# Patient Record
Sex: Female | Born: 1995 | Race: Black or African American | Hispanic: No | Marital: Single | Smoking: Never smoker
Health system: Southern US, Community
[De-identification: ages and names within clinical notes are randomized; demographics above are authoritative.]

## PROBLEM LIST (undated history)

## (undated) DIAGNOSIS — D571 Sickle-cell disease without crisis: Secondary | ICD-10-CM

## (undated) DIAGNOSIS — J45909 Unspecified asthma, uncomplicated: Secondary | ICD-10-CM

## (undated) HISTORY — PX: TONSILLECTOMY: SUR1361

---

## 2018-09-09 ENCOUNTER — Emergency Department (HOSPITAL_COMMUNITY): Payer: Managed Care, Other (non HMO)

## 2018-09-09 ENCOUNTER — Other Ambulatory Visit: Payer: Self-pay

## 2018-09-09 ENCOUNTER — Emergency Department (HOSPITAL_COMMUNITY)
Admission: EM | Admit: 2018-09-09 | Discharge: 2018-09-09 | Disposition: A | Discharge status: Home / Self Care | Payer: Managed Care, Other (non HMO) | Attending: Emergency Medicine | Admitting: Emergency Medicine

## 2018-09-09 ENCOUNTER — Encounter (HOSPITAL_COMMUNITY): Payer: Self-pay | Admitting: Emergency Medicine

## 2018-09-09 DIAGNOSIS — D571 Sickle-cell disease without crisis: Secondary | ICD-10-CM | POA: Insufficient documentation

## 2018-09-09 DIAGNOSIS — J020 Streptococcal pharyngitis: Secondary | ICD-10-CM | POA: Diagnosis not present

## 2018-09-09 DIAGNOSIS — R509 Fever, unspecified: Secondary | ICD-10-CM | POA: Diagnosis present

## 2018-09-09 DIAGNOSIS — Z79899 Other long term (current) drug therapy: Secondary | ICD-10-CM | POA: Diagnosis not present

## 2018-09-09 DIAGNOSIS — N10 Acute tubulo-interstitial nephritis: Secondary | ICD-10-CM | POA: Diagnosis not present

## 2018-09-09 DIAGNOSIS — N12 Tubulo-interstitial nephritis, not specified as acute or chronic: Secondary | ICD-10-CM

## 2018-09-09 HISTORY — DX: Sickle-cell disease without crisis: D57.1

## 2018-09-09 HISTORY — DX: Unspecified asthma, uncomplicated: J45.909

## 2018-09-09 LAB — RETICULOCYTES
Immature Retic Fract: 34.2 % — ABNORMAL HIGH (ref 2.3–15.9)
RBC.: 2.59 MIL/uL — ABNORMAL LOW (ref 3.87–5.11)
Retic Count, Absolute: 358.7 10*3/uL — ABNORMAL HIGH (ref 19.0–186.0)
Retic Ct Pct: 13.9 % — ABNORMAL HIGH (ref 0.4–3.1)

## 2018-09-09 LAB — COMPREHENSIVE METABOLIC PANEL
ALT: 19 U/L (ref 0–44)
AST: 30 U/L (ref 15–41)
Albumin: 4.7 g/dL (ref 3.5–5.0)
Alkaline Phosphatase: 54 U/L (ref 38–126)
Anion gap: 9 (ref 5–15)
BUN: 7 mg/dL (ref 6–20)
CO2: 20 mmol/L — ABNORMAL LOW (ref 22–32)
Calcium: 9.1 mg/dL (ref 8.9–10.3)
Chloride: 107 mmol/L (ref 98–111)
Creatinine, Ser: 0.73 mg/dL (ref 0.44–1.00)
Glucose, Bld: 94 mg/dL (ref 70–99)
POTASSIUM: 3.8 mmol/L (ref 3.5–5.1)
SODIUM: 136 mmol/L (ref 135–145)
Total Bilirubin: 5.3 mg/dL — ABNORMAL HIGH (ref 0.3–1.2)
Total Protein: 8.1 g/dL (ref 6.5–8.1)

## 2018-09-09 LAB — CBC WITH DIFFERENTIAL/PLATELET
ABS IMMATURE GRANULOCYTES: 0.23 10*3/uL — AB (ref 0.00–0.07)
BASOS PCT: 1 %
Basophils Absolute: 0.1 10*3/uL (ref 0.0–0.1)
EOS ABS: 0.5 10*3/uL (ref 0.0–0.5)
Eosinophils Relative: 2 %
HCT: 24.3 % — ABNORMAL LOW (ref 36.0–46.0)
Hemoglobin: 8.5 g/dL — ABNORMAL LOW (ref 12.0–15.0)
IMMATURE GRANULOCYTES: 1 %
Lymphocytes Relative: 15 %
Lymphs Abs: 3.6 10*3/uL (ref 0.7–4.0)
MCH: 32.8 pg (ref 26.0–34.0)
MCHC: 35 g/dL (ref 30.0–36.0)
MCV: 93.8 fL (ref 80.0–100.0)
Monocytes Absolute: 2 10*3/uL — ABNORMAL HIGH (ref 0.1–1.0)
Monocytes Relative: 8 %
NEUTROS ABS: 18.1 10*3/uL — AB (ref 1.7–7.7)
NEUTROS PCT: 73 %
NRBC: 1.6 % — AB (ref 0.0–0.2)
PLATELETS: 340 10*3/uL (ref 150–400)
RBC: 2.59 MIL/uL — AB (ref 3.87–5.11)
RDW: 20.5 % — AB (ref 11.5–15.5)
WBC: 24.7 10*3/uL — AB (ref 4.0–10.5)

## 2018-09-09 LAB — URINALYSIS, ROUTINE W REFLEX MICROSCOPIC
BILIRUBIN URINE: NEGATIVE
Glucose, UA: NEGATIVE mg/dL
KETONES UR: 20 mg/dL — AB
NITRITE: POSITIVE — AB
PROTEIN: NEGATIVE mg/dL
Specific Gravity, Urine: 1.009 (ref 1.005–1.030)
pH: 9 — ABNORMAL HIGH (ref 5.0–8.0)

## 2018-09-09 LAB — I-STAT CG4 LACTIC ACID, ED: LACTIC ACID, VENOUS: 0.93 mmol/L (ref 0.5–1.9)

## 2018-09-09 LAB — I-STAT BETA HCG BLOOD, ED (MC, WL, AP ONLY): I-stat hCG, quantitative: <5 m[IU]/mL (ref <5)

## 2018-09-09 LAB — GROUP A STREP BY PCR: Group A Strep by PCR: DETECTED — AB

## 2018-09-09 LAB — INFLUENZA PANEL BY PCR (TYPE A & B)
INFLAPCR: NEGATIVE
INFLBPCR: NEGATIVE

## 2018-09-09 MED ORDER — SODIUM CHLORIDE 0.9 % IV SOLN
1.0000 g | Freq: Once | INTRAVENOUS | Status: AC
  Administered 2018-09-09: 1 g via INTRAVENOUS
  Filled 2018-09-09: qty 10

## 2018-09-09 MED ORDER — CEPHALEXIN 500 MG PO CAPS: 500.0000 mg | ORAL_CAPSULE | Freq: Three times a day (TID) | ORAL | 0 refills | Status: DC

## 2018-09-09 MED ORDER — ACETAMINOPHEN 325 MG PO TABS
650.0000 mg | ORAL_TABLET | Freq: Once | ORAL | Status: AC | PRN
  Administered 2018-09-09: 650 mg via ORAL
  Filled 2018-09-09: qty 2

## 2018-09-09 MED ORDER — SODIUM CHLORIDE 0.9 % IV BOLUS
1000.0000 mL | Freq: Once | INTRAVENOUS | Status: AC
  Administered 2018-09-09: 1000 mL via INTRAVENOUS

## 2018-09-09 NOTE — ED Provider Notes (Signed)
[facility]-EMERGENCY DEPT Provider Note   CSN: [id number] Arrival date & time: 09/09/18  1858     History   Chief Complaint Chief Complaint  Patient presents with  . Fever  . Sore Throat    HPI Sara Luna is a 22 y.o. female history of sickle cell anemia, here presenting with fever, sore throat.  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  She also did not get her to shot this year.  She states that she woke up today with a sore throat.  States that she also has some subjective chills.  She also had a fever 103 at home.  She complains of some right lower back pain as well.  Denies any cough or shortness of breath.  She denies any urinary symptoms or vomiting.  Patient states that she has a history of sickle cell and is currently on hydroxyurea and ibuprofen and Tylenol.  She not currently on any pain medicines.  Patient did have acute chest about a year ago but she was at [facility] at that time.  She states that she follows up with community wellness currently and does not have a sickle cell doctor.   The history is provided by the patient.    Past Medical History:  Diagnosis Date  . Asthma   . Sickle cell anemia (HCC)     There are no active problems to display for this patient.   Past Surgical History:  Procedure Laterality Date  . TONSILLECTOMY       OB History   None      Home Medications    Prior to Admission medications   Medication Sig Start Date End Date Taking? Authorizing Provider  albuterol (PROVENTIL HFA) 108 (90 Base) MCG/ACT inhaler Inhale 2 puffs into the lungs every 4 (four) hours as needed for wheezing or shortness of breath.  05/13/11  Yes [provider]  EPINEPHrine 0.3 mg/0.3 mL IJ SOAJ injection Inject 0.3 mg into the muscle once as needed (allergic reaction).  05/22/13  Yes [provider]  folic acid (FOLVITE) 1 MG tablet Take 1 mg by mouth daily.    Yes  [provider]  hydroxyurea (HYDREA) 500 MG capsule Take 500 mg by mouth daily.  07/29/17  Yes [provider]    Family History No family history on file.  Social History Social History   Tobacco Use  . Smoking status: Never Smoker  . Smokeless tobacco: Never Used  Substance Use Topics  . Alcohol use: Yes    Comment: occassionally  . Drug use: Not Currently     Allergies   Peanut-containing drug products   Review of Systems Review of Systems  Constitutional: Positive for fever.  HENT: Positive for sore throat.   All other systems reviewed and are negative.    Physical Exam Updated Vital Signs BP (!) 120/58 (BP Location: Right Arm)   Pulse (!) 112   Temp 98.7 F (37.1 C) (Oral)   Resp 18   Ht 5\' 7"  (1.702 m)   Wt 63.5 kg   LMP 08/19/2018 (Exact Date)   SpO2 95%   BMI 21.93 kg/m   Physical Exam  Constitutional: She is oriented to person, place, and time.  Slightly uncomfortable   HENT:  Head: Normocephalic.  Right Ear: Tympanic membrane normal.  Left Ear: Tympanic membrane normal.  Mouth/Throat: Mucous membranes are normal.  Posterior pharynx slightly red, tonsils not enlarged  Eyes: Pupils are equal, round, and reactive to light. EOM are normal.  Neck:  + R cervical LAD   Cardiovascular: Regular rhythm.  Tachycardic   Pulmonary/Chest: Effort normal and breath sounds normal.  Abdominal: Soft. Bowel sounds are normal.  R CVAT vs paralumbar tenderness   Neurological: She is alert and oriented to person, place, and time.  Skin: Skin is warm. Capillary refill takes less than 2 seconds.  Psychiatric: She has a normal mood and affect. Her behavior is normal.  Nursing note and vitals reviewed.    ED Treatments / Results  Labs (all labs ordered are listed, but only abnormal results are displayed) Labs Reviewed  GROUP A STREP BY PCR - Abnormal; Notable for the following components:      Result Value   Group A Strep by PCR DETECTED  (*)    All other components within normal limits  CBC WITH DIFFERENTIAL/PLATELET - Abnormal; Notable for the following components:   WBC 24.7 (*)    RBC 2.59 (*)    Hemoglobin 8.5 (*)    HCT 24.3 (*)    RDW 20.5 (*)    nRBC 1.6 (*)    Neutro Abs 18.1 (*)    Monocytes Absolute 2.0 (*)    Abs Immature Granulocytes 0.23 (*)    All other components within normal limits  COMPREHENSIVE METABOLIC PANEL - Abnormal; Notable for the following components:   CO2 20 (*)    Total Bilirubin 5.3 (*)    All other components within normal limits  RETICULOCYTES - Abnormal; Notable for the following components:   Retic Ct Pct 13.9 (*)    RBC. 2.59 (*)    Retic Count, Absolute 358.7 (*)    Immature Retic Fract 34.2 (*)    All other components within normal limits  URINALYSIS, ROUTINE W REFLEX MICROSCOPIC - Abnormal; Notable for the following components:   pH 9.0 (*)    Hgb urine dipstick SMALL (*)    Ketones, ur 20 (*)    Nitrite POSITIVE (*)    Leukocytes, UA TRACE (*)    Bacteria, UA MANY (*)    All other components within normal limits  CULTURE, BLOOD (ROUTINE X 2)  CULTURE, BLOOD (ROUTINE X 2)  URINE CULTURE  INFLUENZA PANEL BY PCR (TYPE A & B)  I-STAT CG4 LACTIC ACID, ED  I-STAT BETA HCG BLOOD, ED ([facility], [facility], [facility] ONLY)    EKG None  Radiology Dg Chest 2 View  Result Date: 09/09/2018 CLINICAL DATA:  Sickle cell patient.  Fever. EXAM: CHEST - 2 VIEW COMPARISON:  None. FINDINGS: The heart size and mediastinal contours are within normal limits. Both lungs are clear. The visualized skeletal structures are unremarkable. IMPRESSION: No active cardiopulmonary disease. Electronically Signed   By: Burman NievesWilliam  Stevens M.D.   On: 09/09/2018 21:09    Procedures Procedures (including critical care time)  Medications Ordered in ED Medications  acetaminophen (TYLENOL) tablet 650 mg (650 mg Oral Given 09/09/18 1932)  sodium chloride 0.9 % bolus 1,000 mL (1,000 mLs Intravenous New Bag/Given 09/09/18  2022)  cefTRIAXone (ROCEPHIN) 1 g in sodium chloride 0.9 % 100 mL IVPB (0 g Intravenous Stopped 09/09/18 2203)     Initial Impression / Assessment and Plan / ED Course  I have reviewed the triage vital signs and the nursing notes.  Pertinent labs & imaging results that were available during my care of the patient were reviewed by me and considered in my medical decision making (see chart for details).  Sara Luna is a 22 y.o. female here with fever, sore throat. Likely strep vs flu vs pyelo vs acute chest vs pneumonia vs viral syndrome. Will get labs, reticulocyte, CXR, UA, flu, strep. Will hydrate and reassess.   10:37 PM WBC 24. UA + UTI, also strep positive. Both pyelo and strep can cause her WBC count of 24. Patient's CXR is clear. Hg is 8.5 but that is baseline and reticulocyte is elevated appropriately. HR down to low 100s from 130s. No vomiting in the ED. Well appearing. Given rocephin that will treat pyelonephritis and strep. Will dc home with keflex.    Final Clinical Impressions(s) / ED Diagnoses   Final diagnoses:  None    ED Discharge Orders    None       Charlynne Pander, MD 09/09/18 2238

## 2018-09-09 NOTE — ED Notes (Addendum)
Pt refused second set of cultures at this time.

## 2018-09-09 NOTE — Discharge Instructions (Addendum)
Take tylenol, motrin for fever.   Take keflex three times daily for a week for kidney infection and strep throat.   If you don't have fever on Sunday, you can go back to school on Monday.   See your doctor. Consider following up with sickle cell clinic.   Return to ER if you have worse sore throat, fever, trouble breathing, chest pain, abdominal pain, vomiting, dehydration

## 2018-09-09 NOTE — ED Triage Notes (Signed)
Pt presents with a fever and sore throat since yesterday. Patient is a sickle cell patient but not complaining of a crisis. Endorses some back pain but no N/V/D.

## 2018-09-12 LAB — URINE CULTURE: Culture: >=100000 — AB

## 2018-09-13 ENCOUNTER — Telehealth: Payer: Self-pay | Admitting: *Deleted

## 2018-09-13 NOTE — Telephone Encounter (Signed)
Post ED Visit - Positive Culture Follow-up  Culture report reviewed by antimicrobial stewardship pharmacist:  []  Sara Luna, Pharm.D. []  Sara Luna, Pharm.D., BCPS AQ-ID []  Sara Luna, Pharm.D., BCPS []  Sara Luna, [address] BCPS []  Sara Luna, [address] BCPS, AAHIVP []  Sara Luna, Pharm.D., BCPS, AAHIVP []  Sara Luna, PharmD, BCPS []  Sara Luna, PharmD, BCPS []  Sara Luna, PharmD, BCPS []  Sara Luna, PharmD Sara Luna, PharmD   Positive urine culture Treated with Cepahlexin, organism sensitive to the same and no further patient follow-up is required at this time.  Sara Luna, Sara [facility] 09/13/2018, 2:26 PM

## 2018-09-14 LAB — CULTURE, BLOOD (ROUTINE X 2)
CULTURE: NO GROWTH
Culture: NO GROWTH
Special Requests: ADEQUATE

## 2018-12-31 IMAGING — CR DG CHEST 2V
2 series · 2 of 2 positions shown · non-contrast
Comparison: None.

CLINICAL DATA: Sickle cell patient.  Fever.

EXAM:
CHEST - 2 VIEW

[w chest pa]
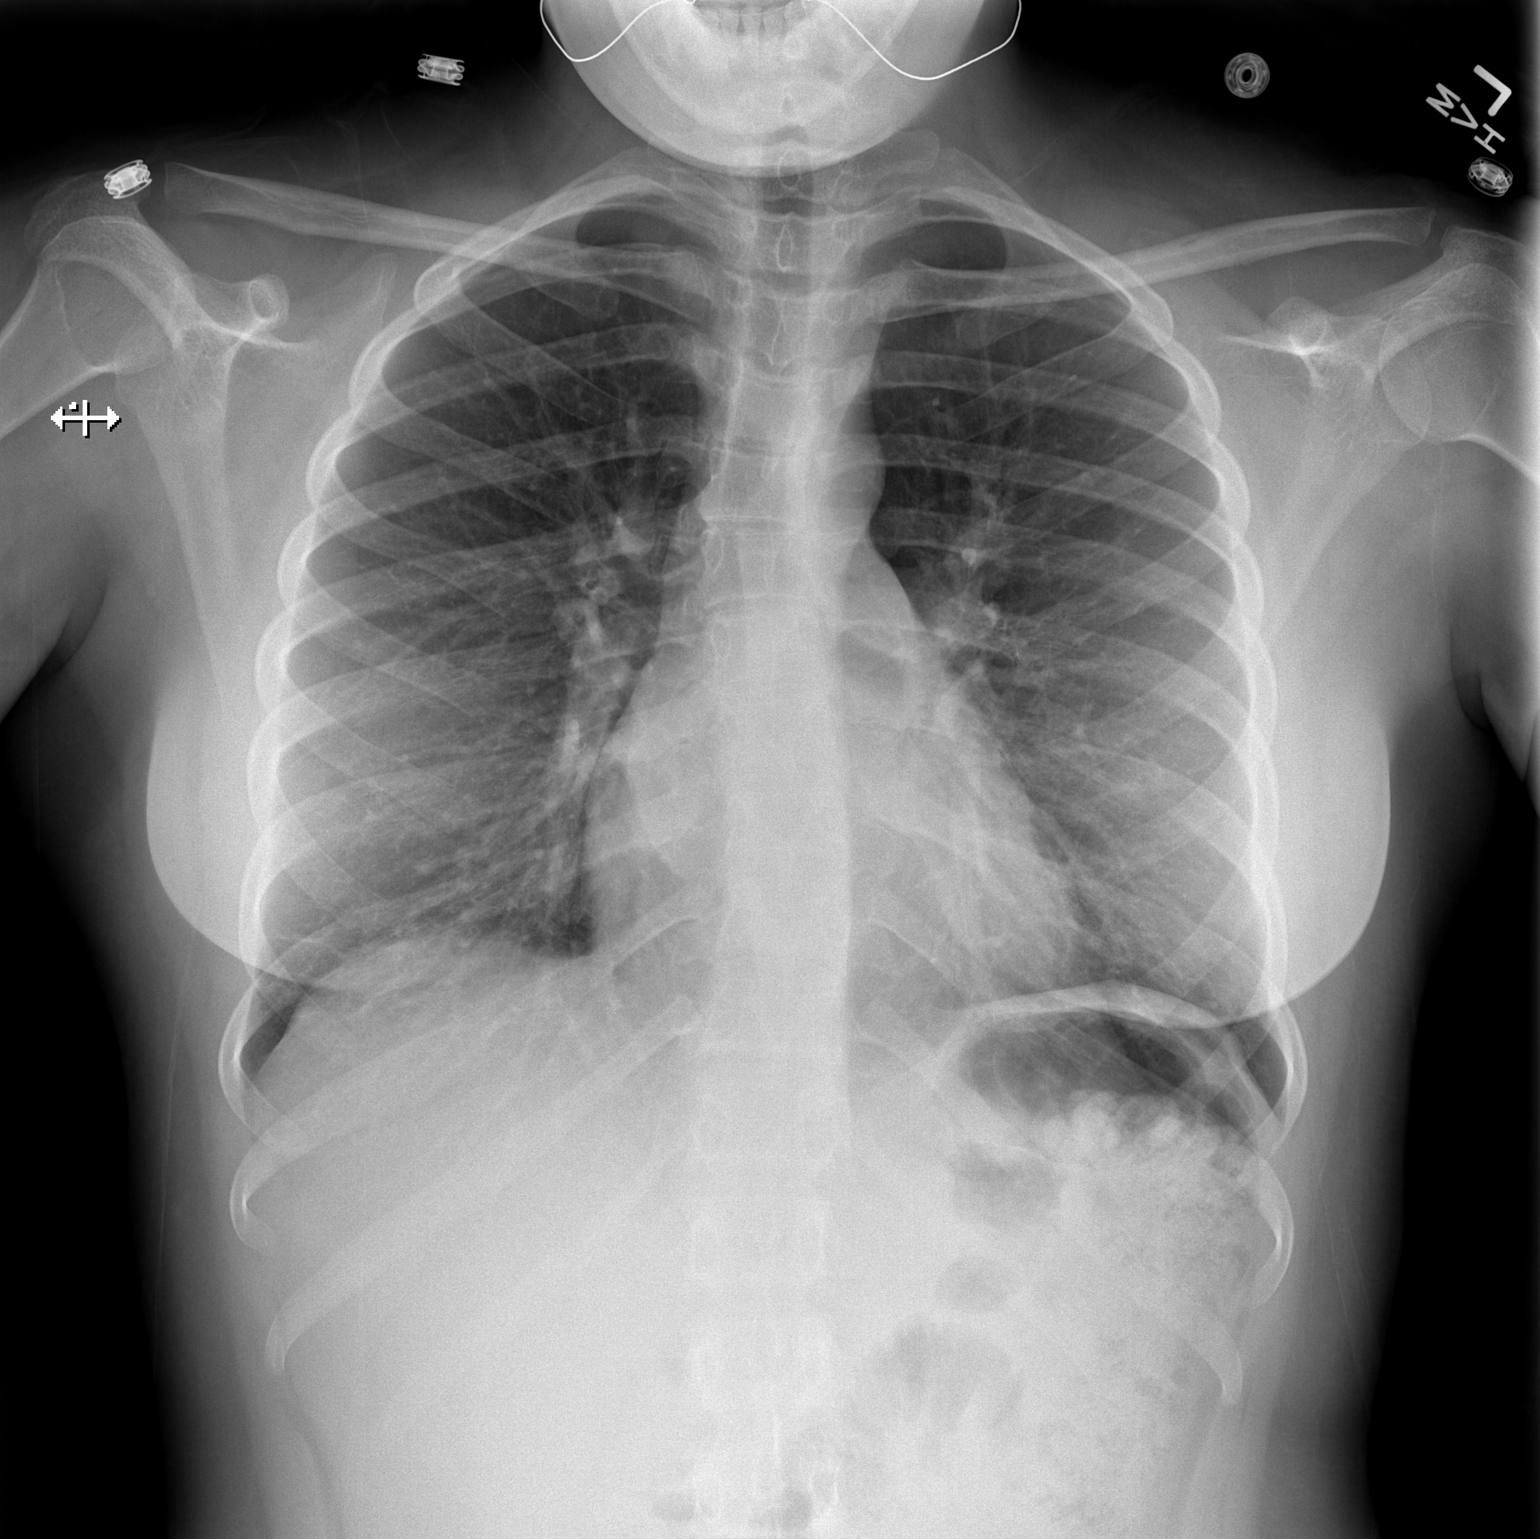

[w chest lat]
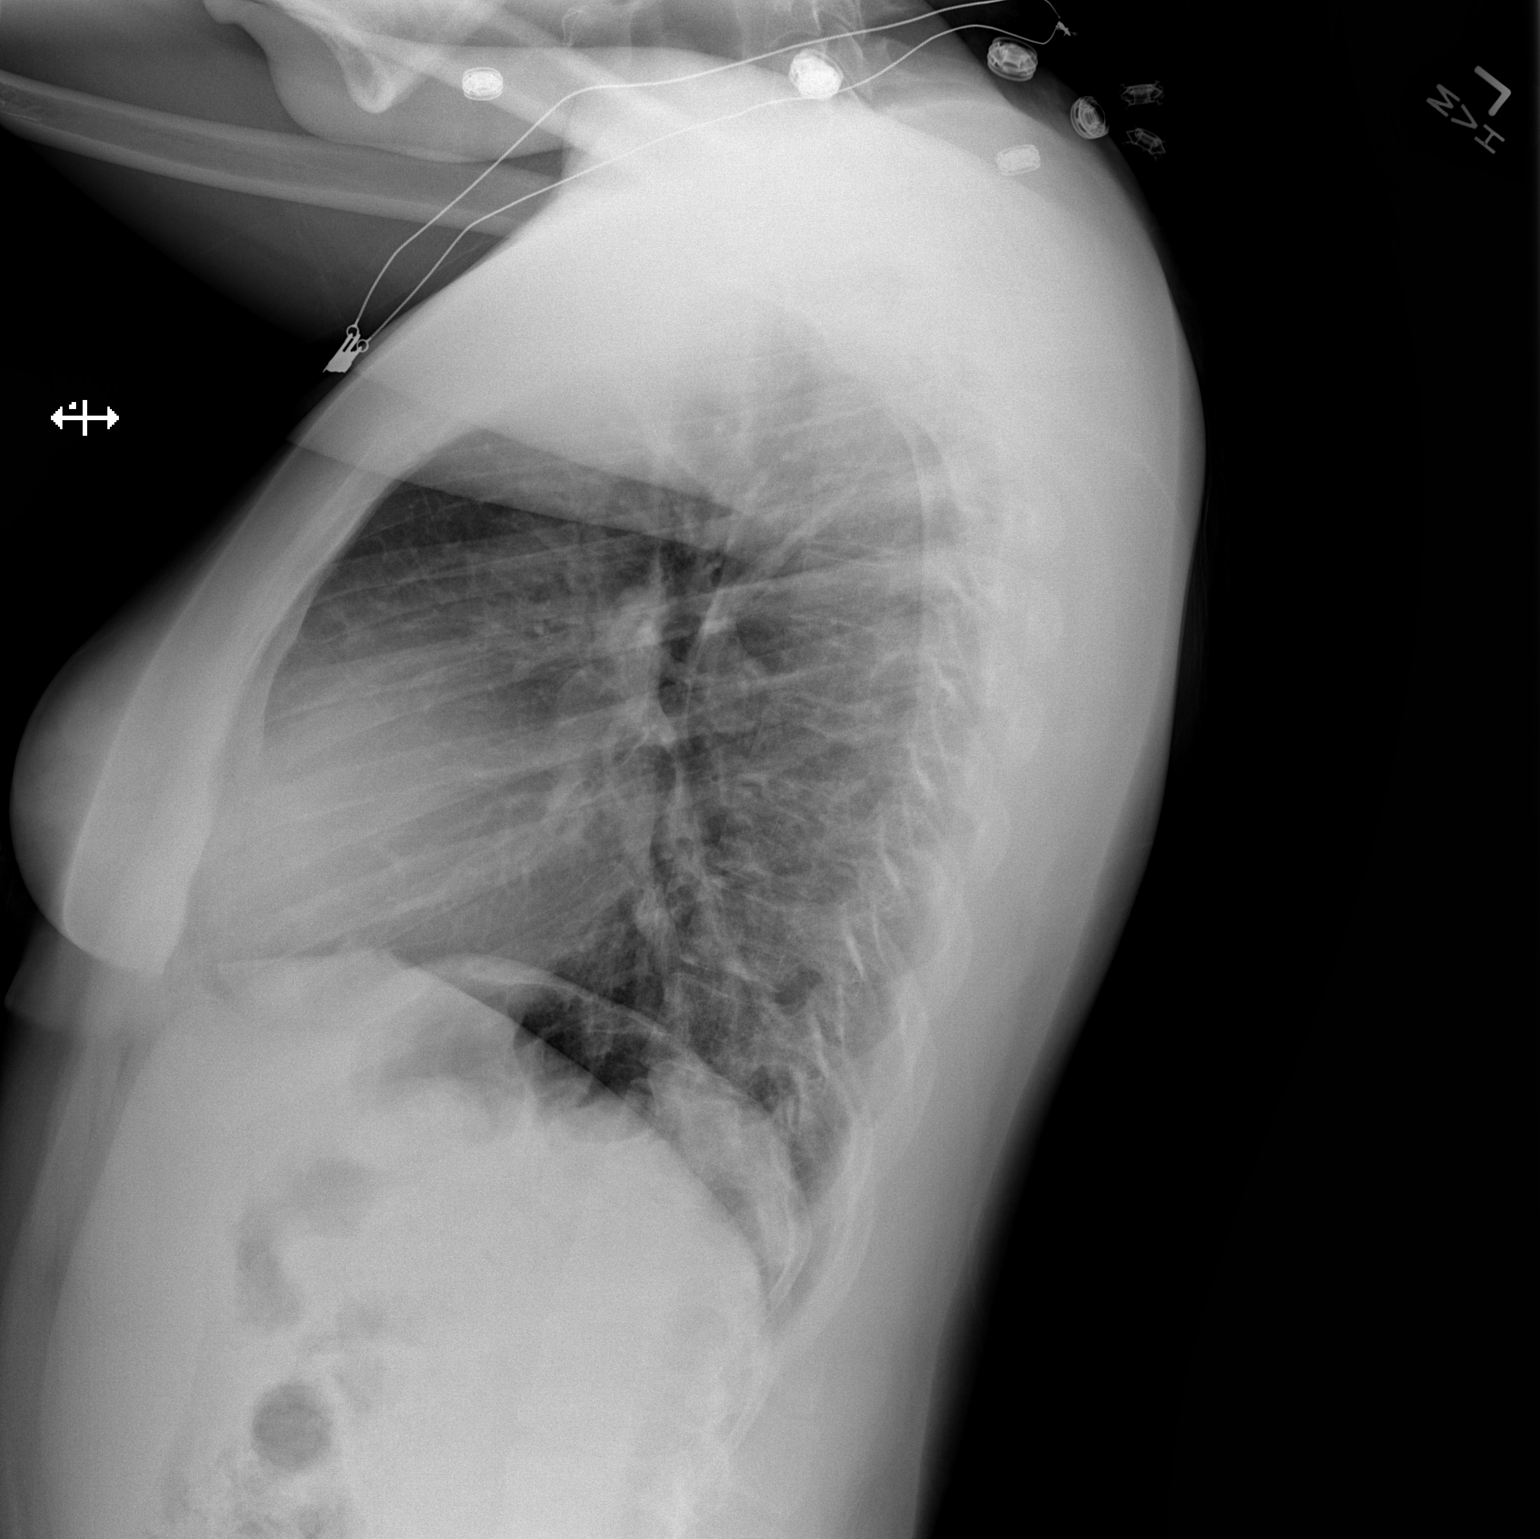

[2 of 2 positions shown; findings below may reference images not displayed]

FINDINGS: The heart size and mediastinal contours are within normal limits.
Both lungs are clear. The visualized skeletal structures are
unremarkable.
IMPRESSION: No active cardiopulmonary disease.

## 2019-05-05 ENCOUNTER — Encounter: Payer: Self-pay | Admitting: Allergy

## 2019-05-05 ENCOUNTER — Ambulatory Visit: Payer: Managed Care, Other (non HMO) | Admitting: Allergy

## 2019-05-05 ENCOUNTER — Other Ambulatory Visit: Payer: Self-pay

## 2019-05-05 VITALS — BP 110/62 | HR 134 | Temp 98.9°F | Resp 16 | Ht 67.0 in | Wt 168.6 lb

## 2019-05-05 DIAGNOSIS — T781XXD Other adverse food reactions, not elsewhere classified, subsequent encounter: Secondary | ICD-10-CM

## 2019-05-05 DIAGNOSIS — J4520 Mild intermittent asthma, uncomplicated: Secondary | ICD-10-CM | POA: Diagnosis not present

## 2019-05-05 DIAGNOSIS — L300 Nummular dermatitis: Secondary | ICD-10-CM

## 2019-05-05 DIAGNOSIS — T7840XD Allergy, unspecified, subsequent encounter: Secondary | ICD-10-CM

## 2019-05-05 DIAGNOSIS — H1013 Acute atopic conjunctivitis, bilateral: Secondary | ICD-10-CM | POA: Diagnosis not present

## 2019-05-05 MED ORDER — PIMECROLIMUS 1 % EX CREA: TOPICAL_CREAM | Freq: Two times a day (BID) | CUTANEOUS | 5 refills | Status: AC

## 2019-05-05 NOTE — Progress Notes (Signed)
New Patient Note  RE: Sara Marusndrea Compean MRN: [id number] DOB: [birth year] Date of Office Visit: 05/05/2019  Referring provider: Maud DeedHowley, Desiree, PA Primary care provider: Maud DeedHowley, Desiree, [place]  Chief Complaint: allergic reactions and rash  History of present illness: Sara Luna is a 23 y.o. female presenting today for consultation for chronic allergic rhinitis.  She reports she will have random "allergic reactions" where she may have eye swelling and facial hives.  She does repot some itchy eyes as well.  She states these symptoms may take several days to resolve.  She has used zyrtec and benadryl for these symptoms.  She states these symptoms can occur anytime of year and has been ongoing for past 5 years.  She reports that Pataday does help with eye itch.  She states she has gone to her PCP several times for these symptoms and has been prescribed prednisone tapers which does help resolve symptoms.  She has not identified any potential triggers.   She also has dark patches in different areas on her skin that she reports will become itchy and "flare up".  These have been ongoing for past 4 years.  [place] told it was pityriasis rosea however it has never resolved thus she does not feel it is this diagnosis.   She does use triamcinolone during flares which does help.    She has food allergy to peanut and states when she has had peanuts she develops itchy throat.  This allergy is from childhood.  She states similar symptoms with walnuts.  She states that she feels better after she "throws up" the nuts.  She avoids tree nuts as well.   She does have history of childhood asthma.  She does have an albuterol inhaler.  She states she does not have many issues with her asthma unless she has a respiratory illnesses.  She also has flovent that she states will take 2 puffs twice a day with illnesses until she gets better.    Review of systems:  Review of Systems  Constitutional: Negative for chills, fever and malaise/fatigue.  HENT: Negative for congestion, ear discharge, nosebleeds and sore throat.   Eyes: Negative for pain, discharge and redness.  Respiratory: Negative for cough, shortness of breath and wheezing.   Cardiovascular: Negative for chest pain.  Gastrointestinal: Negative for abdominal pain, constipation, diarrhea, heartburn, nausea and vomiting.  Musculoskeletal: Negative for joint pain.  Skin: Positive for itching and rash.  Neurological: Negative for headaches.    All other systems negative unless noted above in HPI  Past medical history: Past Medical History:  Diagnosis Date  . Asthma   . Sickle cell anemia (HCC)     Past surgical history: Past Surgical History:  Procedure Laterality Date  . TONSILLECTOMY      Family history:  Family History  Problem Relation Age of Onset  . Hypertension Mother     Social history: Lives in a apartment without carpeting with electric heating and central cooling.  No pets in the home.  No concern for water damage, mildew or roaches in the home.  She is a Runner, broadcasting/film/videoteacher.  Denies smoking history.   Medication List: Allergies as of 05/05/2019      Reactions   Sulfa Antibiotics Rash   Peanut-containing Drug Products Itching      Medication List       Accurate as of May 05, 2019  4:00 PM. If you have any questions, ask  your nurse or doctor.        STOP taking these medications   cephALEXin 500 MG capsule Commonly known as: KEFLEX Stopped by: Shaylar Charmian Muff, MD     TAKE these medications   cetirizine 10 MG tablet Commonly known as: ZYRTEC Take 10 mg by mouth daily as needed for allergies.   EPINEPHrine 0.3 mg/0.3 mL Soaj injection Commonly known as: EPI-PEN Inject 0.3 mg into the muscle once as needed (allergic reaction).   fluticasone 110 MCG/ACT inhaler Commonly known as: FLOVENT HFA Inhale 2 puffs into the lungs 2 (two) times daily. During  Asthma Fare ups   folic acid 1 MG tablet Commonly known as: FOLVITE Take 1 mg by mouth daily.   hydroxyurea 500 MG capsule Commonly known as: HYDREA Take 500 mg by mouth daily.   pimecrolimus 1 % cream Commonly known as: ELIDEL Apply topically 2 (two) times daily. Started by: Shaylar Charmian Muff, MD   Proventil HFA 108 [phone] Base) MCG/ACT inhaler Generic drug: albuterol Inhale 2 puffs into the lungs every 4 (four) hours as needed for wheezing or shortness of breath.   triamcinolone ointment 0.5 % Commonly known as: KENALOG APPLY TO AFFECTED AREA TWICE A DAY       Known medication allergies: Allergies  Allergen Reactions  . Sulfa Antibiotics Rash  . Peanut-Containing Drug Products Itching     Physical examination: Blood pressure 110/62, pulse (!) 134, temperature 98.9 F (37.2 C), temperature source Temporal, resp. rate 16, height 5\' 7"  (1.702 m), weight 168 lb 9.6 oz (76.5 kg), SpO2 91 %.  General: Alert, interactive, in no acute distress. HEENT: PERRLA, TMs pearly gray, turbinates non-edematous without discharge, post-pharynx non erythematous. Neck: Supple without lymphadenopathy. Lungs: Clear to auscultation without wheezing, rhonchi or rales. {no increased work of breathing. CV: Normal S1, S2 without murmurs. Abdomen: Nondistended, nontender. Skin: right upper thigh, lateral upper arms b/l, back with round dry hyperpigmented patches. Extremities:  No clubbing, cyanosis or edema. Neuro:   Grossly intact.  Diagnositics/Labs:  Spirometry: FEV1: 2.99L 95%, FVC: 3.72L 103%, ratio consistent with nonobstructive pattern  Allergy testing: environmental allergy skin prick testing is positive to tree pollens, molds, dus tmites, cat, dog, mixed feathers, horse. Select food allergy skin prick testing is positive to peanut, soybean, wheat, cashew, pecan, hazelnut, pistachio. Select food allergy skin prick testing is   Allergy testing results were read and interpreted  by provider, documented by clinical staff.   Assessment and plan:   Allergic reaction with allergic conjunctivitis  - environmental allergy skin testing today is positive to tree pollens, molds, dust mites, cat, dog, mixed feathers (ie. 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  Will  discuss Dupixent further once diagnosis confirmed.   Adverse food reaction  - food allergy skin testing is positive to peanut, tree nuts, soybean and wheat.    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   You may be sensitized only to soybean and wheat products and not truly allergic.  Would monitor for any symptoms following any wheat or soybean ingestion and if you develop symptoms them remove food item from diet.   - continue avoidance of peanut and tree nuts  - have access to self-injectable epinephrine Epipen 0.3mg  at all times  - follow emergency action plan in case of allergic reaction  Mild intermittent asthma  - have access to albuterol inhaler 2 puffs every 4-6 hours as needed for cough/wheeze/shortness of breath/chest tightness.  May use 15-20 minutes prior to activity.   Monitor frequency  of use.    - continue use Flovent 110mcg 2 puffs twice a day during flares or respiratory illnesses  - Asthma control goals:   Full participation in all desired activities (may need albuterol before activity)  Albuterol use two time or less a week on average (not counting use with activity)  Cough interfering with sleep two time or less a month  Oral steroids no more than once a year  No hospitalizations  Follow-up 4 months or sooner if needed  I appreciate the opportunity to take part in Aroura's care. Please do not hesitate to contact me with questions.  Sincerely,   Margo AyeShaylar Padgett, MD Allergy/Immunology [facility]

## 2019-05-05 NOTE — Patient Instructions (Addendum)
 -   environmental allergy skin testing today is positive to tree pollens, molds, dust mites, cat, dog, mixed feathers (ie. Down), horse  - allergen avoidance measures discussed/handouts provided  - for itchy/watery/red eyes continue use of Pataday 1 drop each eye daily as needed  - for general allergy symptoms relief recommend use of Xyzal 5mg  daily (may take additional dose if needed)  - for control of hives add in Pepcid 20mg  twice a day to your Xyzal until resolved   - skin rash may be nummular eczema  - continue daily moisturization especially after bathing/showering  - use non-steroidal cream, Elidel, on flared areas (dry/itchy/scaly) twice a day in thin layer until improved  - can continue use of Triamcinolone as needed  - will place dermatology referral for likely biopsy to determine formal diagnosis of this rash  - if rash is confirmed as an eczema variant (like nummular eczema) then you may benefit from Dupixent injections to help treat/manage rash.  Will discuss Dupixent further once diagnosis confirmed.    - food allergy skin testing is positive to peanut, tree nuts, soybean and wheat.    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   You may be sensitized only to soybean and wheat products and not truly allergic.  Would monitor for any symptoms following any wheat or soybean ingestion and if you develop symptoms them remove food item from diet.   - continue avoidance of peanut and tree nuts  - have access to self-injectable epinephrine Epipen 0.3mg  at all times  - follow emergency action plan in case of allergic reaction   - have access to albuterol inhaler 2 puffs every 4-6 hours as needed for cough/wheeze/shortness of breath/chest tightness.  May use 15-20 minutes prior to activity.   Monitor frequency of use.    - continue use Flovent 193mcg 2 puffs twice a day during flares or respiratory illnesses  - Asthma control goals:   Full participation in all desired activities  (may need albuterol before activity)  Albuterol use two time or less a week on average (not counting use with activity)  Cough interfering with sleep two time or less a month  Oral steroids no more than once a year  No hospitalizations  Follow-up 4 months or sooner if needed

## 2019-08-20 ENCOUNTER — Other Ambulatory Visit: Payer: Self-pay

## 2019-08-20 ENCOUNTER — Emergency Department (HOSPITAL_COMMUNITY): Payer: Managed Care, Other (non HMO)

## 2019-08-20 ENCOUNTER — Emergency Department (HOSPITAL_COMMUNITY)
Admission: EM | Admit: 2019-08-20 | Discharge: 2019-08-21 | Disposition: A | Discharge status: Home / Self Care | Payer: Managed Care, Other (non HMO) | Attending: Emergency Medicine | Admitting: Emergency Medicine

## 2019-08-20 ENCOUNTER — Encounter (HOSPITAL_COMMUNITY): Payer: Self-pay

## 2019-08-20 DIAGNOSIS — Z20828 Contact with and (suspected) exposure to other viral communicable diseases: Secondary | ICD-10-CM | POA: Diagnosis not present

## 2019-08-20 DIAGNOSIS — Z5321 Procedure and treatment not carried out due to patient leaving prior to being seen by health care provider: Secondary | ICD-10-CM | POA: Diagnosis not present

## 2019-08-20 DIAGNOSIS — M7918 Myalgia, other site: Secondary | ICD-10-CM | POA: Diagnosis not present

## 2019-08-20 DIAGNOSIS — R509 Fever, unspecified: Secondary | ICD-10-CM | POA: Diagnosis present

## 2019-08-20 MED ORDER — ACETAMINOPHEN 325 MG PO TABS
650.0000 mg | ORAL_TABLET | Freq: Once | ORAL | Status: AC | PRN
  Administered 2019-08-20: 650 mg via ORAL
  Filled 2019-08-20: qty 2

## 2019-08-20 NOTE — ED Triage Notes (Signed)
Pt states that since this morning, she has had fever, body aches (espeically in thighs) and slight cough.  Denies N/V/D. Denies CP.  Theraflu PM at 1400

## 2019-08-20 NOTE — ED Notes (Signed)
Pt called for room, no response from lobby 

## 2019-08-21 ENCOUNTER — Encounter (HOSPITAL_COMMUNITY): Payer: Self-pay | Admitting: *Deleted

## 2019-08-21 ENCOUNTER — Emergency Department (HOSPITAL_COMMUNITY)
Admission: EM | Admit: 2019-08-21 | Discharge: 2019-08-21 | Disposition: A | Discharge status: Home / Self Care | Payer: Managed Care, Other (non HMO) | Source: Home / Self Care | Attending: Emergency Medicine | Admitting: Emergency Medicine

## 2019-08-21 ENCOUNTER — Emergency Department (HOSPITAL_COMMUNITY): Payer: Managed Care, Other (non HMO)

## 2019-08-21 ENCOUNTER — Other Ambulatory Visit: Payer: Self-pay

## 2019-08-21 DIAGNOSIS — D571 Sickle-cell disease without crisis: Secondary | ICD-10-CM

## 2019-08-21 DIAGNOSIS — Z91010 Allergy to peanuts: Secondary | ICD-10-CM | POA: Insufficient documentation

## 2019-08-21 DIAGNOSIS — R509 Fever, unspecified: Secondary | ICD-10-CM

## 2019-08-21 DIAGNOSIS — R05 Cough: Secondary | ICD-10-CM | POA: Insufficient documentation

## 2019-08-21 DIAGNOSIS — U071 COVID-19: Secondary | ICD-10-CM | POA: Insufficient documentation

## 2019-08-21 DIAGNOSIS — J45909 Unspecified asthma, uncomplicated: Secondary | ICD-10-CM | POA: Insufficient documentation

## 2019-08-21 DIAGNOSIS — R519 Headache, unspecified: Secondary | ICD-10-CM | POA: Insufficient documentation

## 2019-08-21 DIAGNOSIS — M7918 Myalgia, other site: Secondary | ICD-10-CM | POA: Insufficient documentation

## 2019-08-21 DIAGNOSIS — Z79899 Other long term (current) drug therapy: Secondary | ICD-10-CM | POA: Insufficient documentation

## 2019-08-21 LAB — COMPREHENSIVE METABOLIC PANEL
ALT: 22 U/L (ref 0–44)
AST: 41 U/L (ref 15–41)
Albumin: 4.5 g/dL (ref 3.5–5.0)
Alkaline Phosphatase: 49 U/L (ref 38–126)
Anion gap: 9 (ref 5–15)
BUN: 6 mg/dL (ref 6–20)
CO2: 21 mmol/L — ABNORMAL LOW (ref 22–32)
Calcium: 8.8 mg/dL — ABNORMAL LOW (ref 8.9–10.3)
Chloride: 106 mmol/L (ref 98–111)
Creatinine, Ser: 0.64 mg/dL (ref 0.44–1.00)
GFR calc Af Amer: >60 mL/min (ref >60)
GFR calc non Af Amer: >60 mL/min (ref >60)
Glucose, Bld: 90 mg/dL (ref 70–99)
Potassium: 3.6 mmol/L (ref 3.5–5.1)
Sodium: 136 mmol/L (ref 135–145)
Total Bilirubin: 5.3 mg/dL — ABNORMAL HIGH (ref 0.3–1.2)
Total Protein: 7.8 g/dL (ref 6.5–8.1)

## 2019-08-21 LAB — CBC WITH DIFFERENTIAL/PLATELET
Abs Immature Granulocytes: 0.07 10*3/uL (ref 0.00–0.07)
Basophils Absolute: 0.2 10*3/uL — ABNORMAL HIGH (ref 0.0–0.1)
Basophils Relative: 1 %
Eosinophils Absolute: 0.1 10*3/uL (ref 0.0–0.5)
Eosinophils Relative: 0 %
HCT: 22.4 % — ABNORMAL LOW (ref 36.0–46.0)
Hemoglobin: 7.6 g/dL — ABNORMAL LOW (ref 12.0–15.0)
Immature Granulocytes: 1 %
Lymphocytes Relative: 25 %
Lymphs Abs: 3.5 10*3/uL (ref 0.7–4.0)
MCH: 27.5 pg (ref 26.0–34.0)
MCHC: 33.9 g/dL (ref 30.0–36.0)
MCV: 81.2 fL (ref 80.0–100.0)
Monocytes Absolute: 2.6 10*3/uL — ABNORMAL HIGH (ref 0.1–1.0)
Monocytes Relative: 19 %
Neutro Abs: 7.6 10*3/uL (ref 1.7–7.7)
Neutrophils Relative %: 54 %
Platelets: 434 10*3/uL — ABNORMAL HIGH (ref 150–400)
RBC: 2.76 MIL/uL — ABNORMAL LOW (ref 3.87–5.11)
RDW: 24.8 % — ABNORMAL HIGH (ref 11.5–15.5)
WBC: 13.9 10*3/uL — ABNORMAL HIGH (ref 4.0–10.5)
nRBC: 5.2 % — ABNORMAL HIGH (ref 0.0–0.2)

## 2019-08-21 LAB — URINALYSIS, ROUTINE W REFLEX MICROSCOPIC
Bilirubin Urine: NEGATIVE
Glucose, UA: NEGATIVE mg/dL
Ketones, ur: 20 mg/dL — AB
Nitrite: NEGATIVE
Protein, ur: 30 mg/dL — AB
Specific Gravity, Urine: 1.011 (ref 1.005–1.030)
pH: 6 (ref 5.0–8.0)

## 2019-08-21 LAB — I-STAT BETA HCG BLOOD, ED (MC, WL, AP ONLY): I-stat hCG, quantitative: <5 m[IU]/mL (ref <5)

## 2019-08-21 LAB — LACTIC ACID, PLASMA: Lactic Acid, Venous: 1.2 mmol/L (ref 0.5–1.9)

## 2019-08-21 LAB — SARS CORONAVIRUS 2 (TAT 6-24 HRS): SARS Coronavirus 2: POSITIVE — AB

## 2019-08-21 MED ORDER — SODIUM CHLORIDE 0.9 % IV SOLN
INTRAVENOUS | Status: DC
  Administered 2019-08-21: 12:00:00 via INTRAVENOUS

## 2019-08-21 MED ORDER — SODIUM CHLORIDE 0.9 % IV SOLN
1.0000 g | Freq: Once | INTRAVENOUS | Status: AC
  Administered 2019-08-21: 1 g via INTRAVENOUS
  Filled 2019-08-21: qty 10

## 2019-08-21 MED ORDER — ACETAMINOPHEN 500 MG PO TABS
1000.0000 mg | ORAL_TABLET | Freq: Once | ORAL | Status: AC
  Administered 2019-08-21: 1000 mg via ORAL
  Filled 2019-08-21: qty 2

## 2019-08-21 NOTE — ED Triage Notes (Addendum)
Pt states she had a temp of 104.0 yesterday. Today 102.o, body and headache. Cough mostly dry

## 2019-08-21 NOTE — ED Notes (Signed)
While in another room, patient called out stating she didn't want to be admitted. Went to bedside, patient states she does not want to be admitted. She has spoken with her mom, who stated to her that this has happened before where she improved with the Rocephin. Further reports, if she starts feeling bad she would come back to the emergency department for another dose of antibiotic. Also, is concerned because she just moved to the [place] and disappointed she would not have visitors. Dr. Maryan Rued made aware. She was coming to speak with the patient, when the hospitalist was entering the room. Dr. Maryan Rued explained the concern to the hospitalist prior to entering the room.

## 2019-08-21 NOTE — ED Provider Notes (Addendum)
[facility] DEPT Provider Note   CSN: [id number] Arrival date & time: 08/21/19  1052     History   Chief Complaint Chief Complaint  Patient presents with  . Fever  . Generalized Body Aches  . Headache    HPI Sara Luna is a 23 y.o. female.     The history is provided by the patient.  Fever Max temp prior to arrival:  104 Temp source:  Oral Severity:  Moderate Onset quality:  Gradual Duration:  1 day Timing:  Constant Progression:  Worsening Chronicity:  New Relieved by:  Acetaminophen Worsened by:  Nothing Ineffective treatments:  None tried Associated symptoms: cough, headaches and myalgias   Associated symptoms: no chest pain, no congestion, no diarrhea, no dysuria, no nausea, no somnolence, no sore throat and no vomiting   Associated symptoms comment:  No SOB.  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 Asthma   . Sickle cell anemia (HCC)     There are no active problems to display for this patient.   Past Surgical History:  Procedure Laterality Date  . TONSILLECTOMY       OB History   No obstetric history on file.      Home Medications    Prior to Admission medications   Medication Sig Start Date End Date Taking? Authorizing Provider  albuterol (PROVENTIL HFA) 108 (90 Base) MCG/ACT inhaler Inhale 2 puffs into the lungs every 4 (four) hours as needed for wheezing or shortness of breath.  05/13/11   [provider]  cetirizine (ZYRTEC) 10 MG tablet Take 10 mg by mouth daily as needed for allergies.    [provider]  EPINEPHrine 0.3 mg/0.3 mL IJ SOAJ injection Inject 0.3 mg into the muscle once as needed (allergic reaction).   05/22/13   [provider]  fluticasone (FLOVENT HFA) 110 MCG/ACT inhaler Inhale 2 puffs into the lungs 2 (two) times daily. During Asthma Fare ups    [provider]  folic acid (FOLVITE) 1 MG tablet Take 1 mg by mouth daily.     [provider]  hydroxyurea (HYDREA) 500 MG capsule Take 500 mg by mouth daily.  07/29/17   [provider]  pimecrolimus (ELIDEL) 1 % cream Apply topically 2 (two) times daily. 05/05/19   Kennith Gain, MD  triamcinolone ointment (KENALOG) 0.5 % APPLY TO AFFECTED AREA TWICE A DAY 03/27/19   [provider]    Family History Family History  Problem Relation Age of Onset  . Hypertension Mother     Social History Social History   Tobacco Use  . Smoking status: Never Smoker  . Smokeless tobacco: Never Used  Substance Use Topics  . Alcohol use: Yes    Comment: occassionally  . Drug use: Not Currently     Allergies   Sulfa antibiotics and Peanut-containing drug products   Review of Systems Review of Systems  Constitutional: Positive for fever.  HENT: Negative for congestion and sore throat.   Respiratory: Positive for cough.   Cardiovascular: Negative for chest pain.  Gastrointestinal: Negative for diarrhea, nausea and vomiting.  Genitourinary: Negative for dysuria.  Musculoskeletal: Positive for myalgias.  Neurological: Positive for headaches.  All  other systems reviewed and are negative.    Physical Exam Updated Vital Signs BP 124/63 (BP Location: Left Arm)   Pulse (!) 108   Temp 99.6 F (37.6 C) (Oral)   Resp 18   Ht 5\' 7"  (1.702 m)   Wt 68 kg   LMP 08/19/2019   SpO2 96%   BMI 23.49 kg/m   Physical Exam Vitals signs and nursing note reviewed.  Constitutional:      General: She is not in acute distress.    Appearance: She is well-developed and normal weight.  HENT:     Head: Normocephalic and atraumatic.     Right Ear: Tympanic membrane normal.     Left Ear: Tympanic  membrane normal.     Nose: Nose normal.     Mouth/Throat:     Mouth: Mucous membranes are moist.     Pharynx: No oropharyngeal exudate.  Eyes:     Pupils: Pupils are equal, round, and reactive to light.  Cardiovascular:     Rate and Rhythm: Regular rhythm. Tachycardia present.     Heart sounds: Normal heart sounds. No murmur. No friction rub.  Pulmonary:     Effort: Pulmonary effort is normal.     Breath sounds: Normal breath sounds. No wheezing or rales.  Abdominal:     General: Bowel sounds are normal. There is no distension.     Palpations: Abdomen is soft.     Tenderness: There is no abdominal tenderness. There is no guarding or rebound.  Musculoskeletal: Normal range of motion.        General: No tenderness.     Right lower leg: No edema.     Left lower leg: No edema.     Comments: No edema  Skin:    General: Skin is warm and dry.     Capillary Refill: Capillary refill takes less than 2 seconds.     Findings: No rash.  Neurological:     General: No focal deficit present.     Mental Status: She is alert and oriented to person, place, and time. Mental status is at baseline.     Cranial Nerves: No cranial nerve deficit.  Psychiatric:        Mood and Affect: Mood normal.        Behavior: Behavior normal.        Thought Content: Thought content normal.      ED Treatments / Results  Labs (all labs ordered are listed, but only abnormal results are displayed) Labs Reviewed  CBC WITH DIFFERENTIAL/PLATELET - Abnormal; Notable for the following components:      Result Value   WBC 13.9 (*)    RBC 2.76 (*)    Hemoglobin 7.6 (*)    HCT 22.4 (*)    RDW 24.8 (*)    Platelets 434 (*)    nRBC 5.2 (*)    Monocytes Absolute 2.6 (*)    Basophils Absolute 0.2 (*)    All other components within normal limits  COMPREHENSIVE METABOLIC PANEL - Abnormal; Notable for the following components:   CO2 21 (*)    Calcium 8.8 (*)    Total Bilirubin 5.3 (*)    All other components within  normal limits  URINALYSIS, ROUTINE W REFLEX MICROSCOPIC - Abnormal; Notable for the following components:   Hgb urine dipstick LARGE (*)    Ketones, ur 20 (*)    Protein, ur 30 (*)    Leukocytes,Ua TRACE (*)    Bacteria, UA RARE (*)  All other components within normal limits  CULTURE, BLOOD (ROUTINE X 2)  CULTURE, BLOOD (ROUTINE X 2)  SARS CORONAVIRUS 2 (TAT 6-24 HRS)  LACTIC ACID, PLASMA  I-STAT BETA HCG BLOOD, ED ([facility], [facility], [facility] ONLY)    EKG None  Radiology Dg Chest 2 View  Result Date: 08/20/2019 CLINICAL DATA:  Cough fever.  Asthma.  Sickle cell disease. EXAM: CHEST - 2 VIEW COMPARISON:  09/09/2018 FINDINGS: The heart size and mediastinal contours are within normal limits. Both lungs are clear. The visualized skeletal structures are unremarkable. IMPRESSION: No active cardiopulmonary disease. Electronically Signed   By: Danae Orleans M.D.   On: 08/20/2019 17:27   Dg Chest Port 1 View  Result Date: 08/21/2019 CLINICAL DATA:  Cough and fever EXAM: PORTABLE CHEST 1 VIEW COMPARISON:  August 20, 2019 FINDINGS: Lungs are clear. Heart size and pulmonary vascularity are within normal limits. No adenopathy. No bone lesions. IMPRESSION: No edema or consolidation. Electronically Signed   By: Bretta Bang III M.D.   On: 08/21/2019 12:12    Procedures Procedures (including critical care time)  Medications Ordered in ED Medications  0.9 %  sodium chloride infusion (has no administration in time range)     Initial Impression / Assessment and Plan / ED Course  I have reviewed the triage vital signs and the nursing notes.  Pertinent labs & imaging results that were available during my care of the patient were reviewed by me and considered in my medical decision making (see chart for details).        Patient is a pleasant 23 year old female presenting today with URI symptoms since yesterday and fever as high as 104.  She has a mild cough but denies any chest pain or shortness of  breath.  Vital signs show oxygen saturation of 96% and mild tachycardia of 108.  She is well-appearing and denies any known Covid contacts.  However patient does have a history of sickle cell disease.  Labs and blood cultures pending.  Will discuss with hospitalist once results return.  1:22 PM Mild leukocytosis of 13.9, hemoglobin of 7.6 which seems to be in her baseline.  Urine pregnancy test is negative, CMP without acute findings, chest x-ray and urine without acute findings.  Patient at this point has fever of an unknown origin.  Covid test is still pending.  Spoke with sickle cell team who will admit patient under observation.  Blood cultures pending.  Patient given IV Rocephin.  2:50 PM Pt does not want to stay.  Spoke with [place] with sickle cell and she will f/u in the clinic on Wednesday for recheck.  Pt give strict return precautions and told to use fever control.  Final Clinical Impressions(s) / ED Diagnoses   Final diagnoses:  Hb-SS disease without crisis (HCC)  Fever of unknown origin (FUO)    ED Discharge Orders    None       Gwyneth Sprout, MD 08/21/19 1323    Gwyneth Sprout, MD 08/21/19 1452

## 2019-08-21 NOTE — Discharge Instructions (Signed)
Continue to drink plenty of fluids and take Tylenol as needed for fever.  If you start feeling worse having shortness of breath, chest pain or feeling like you may pass out please return to the emergency room immediately.

## 2019-08-23 ENCOUNTER — Inpatient Hospital Stay: Payer: Managed Care, Other (non HMO) | Admitting: Family Medicine

## 2019-08-26 LAB — CULTURE, BLOOD (ROUTINE X 2)
Culture: NO GROWTH
Culture: NO GROWTH

## 2019-09-06 ENCOUNTER — Ambulatory Visit: Payer: Managed Care, Other (non HMO) | Admitting: Allergy

## 2019-12-12 IMAGING — DX DG CHEST 1V PORT
1 series · 1 of 1 positions shown · non-contrast
Comparison: August 20, 2019

CLINICAL DATA: Cough and fever

EXAM:
PORTABLE CHEST 1 VIEW

[chest ap]
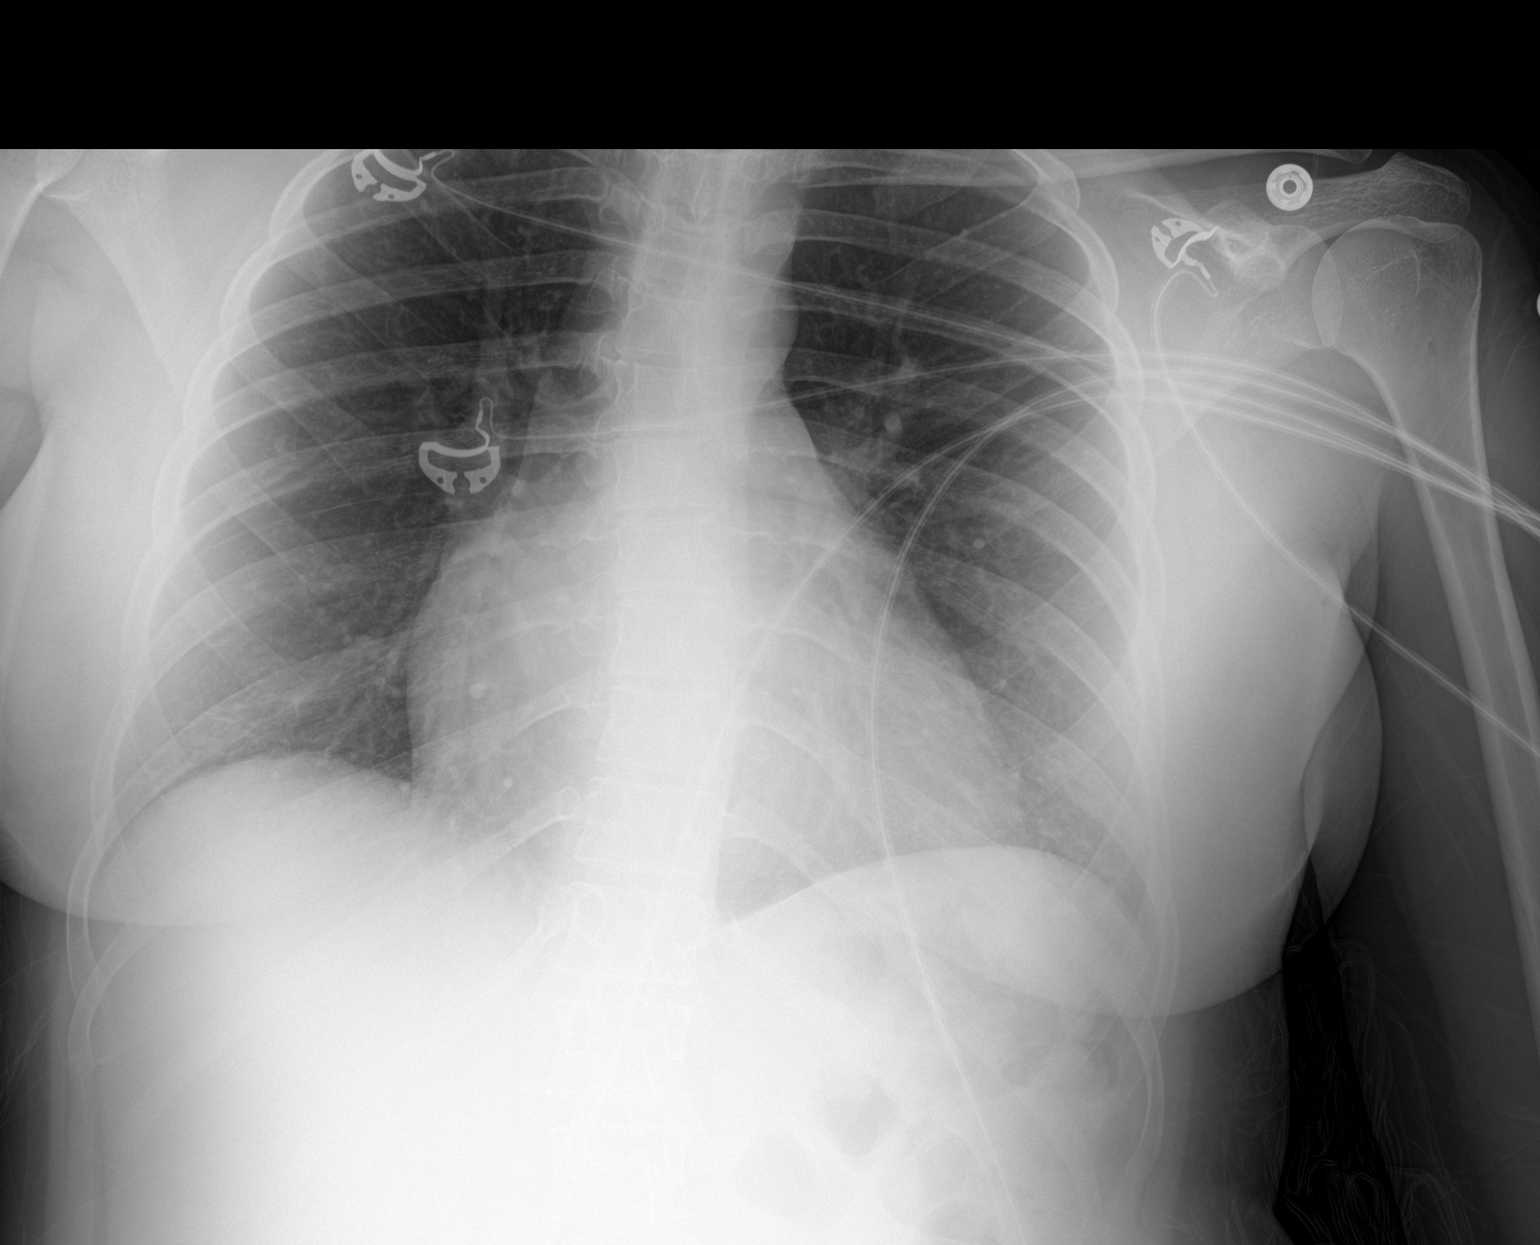

[1 of 1 positions shown; findings below may reference images not displayed]

FINDINGS: Lungs are clear. Heart size and pulmonary vascularity are within
normal limits. No adenopathy. No bone lesions.
IMPRESSION: No edema or consolidation.

## 2020-01-13 ENCOUNTER — Ambulatory Visit: Payer: Managed Care, Other (non HMO) | Attending: Internal Medicine

## 2020-01-13 DIAGNOSIS — Z23 Encounter for immunization: Secondary | ICD-10-CM

## 2020-01-13 NOTE — Progress Notes (Signed)
   Covid-19 Vaccination Clinic  Name:  Sara Luna    MRN: [id number] DOB: [birth year]  01/13/2020  Sara Luna was observed post Covid-19 immunization for 15 minutes without incident. She was provided with Vaccine Information Sheet and instruction to access the V-Safe system.   Sara Luna was instructed to call 911 with any severe reactions post vaccine: Marland Kitchen Difficulty breathing  . Swelling of face and throat  . A fast heartbeat  . A bad rash all over body  . Dizziness and weakness   Immunizations Administered    Name Date Dose VIS Date Route   Pfizer COVID-19 Vaccine 01/13/2020 11:33 AM 0.3 mL 09/29/2019 Intramuscular   Manufacturer: [employer], [employer]   Lot: [id number]   NDC: [address]-2

## 2020-02-07 ENCOUNTER — Ambulatory Visit: Payer: Managed Care, Other (non HMO) | Attending: Internal Medicine

## 2020-02-07 DIAGNOSIS — Z23 Encounter for immunization: Secondary | ICD-10-CM

## 2020-02-07 NOTE — Progress Notes (Signed)
   Covid-19 Vaccination Clinic  Name:  Laurren Lepkowski    MRN: [id number] DOB: [birth year]  02/07/2020  Ms. Sperry was observed post Covid-19 immunization for 15 minutes without incident. She was provided with Vaccine Information Sheet and instruction to access the V-Safe system.   Ms. Allbritton was instructed to call 911 with any severe reactions post vaccine: Marland Kitchen Difficulty breathing  . Swelling of face and throat  . A fast heartbeat  . A bad rash all over body  . Dizziness and weakness   Immunizations Administered    Name Date Dose VIS Date Route   Pfizer COVID-19 Vaccine 02/07/2020  8:35 AM 0.3 mL 12/13/2018 Intramuscular   Manufacturer: [employer], [employer]   Lot: [id number]   NDC: [address]-6
